# Patient Record
Sex: Male | Born: 2005
Health system: Southern US, Community
[De-identification: ages and names within clinical notes are randomized; demographics above are authoritative.]

---
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
## 2023-01-10 IMAGING — DX DG CHEST 2V
2 series · 2 of 2 positions shown · non-contrast
Comparison: None.

CLINICAL DATA: Shortness of breath

EXAM:
CHEST - 2 VIEW

[chest pa]
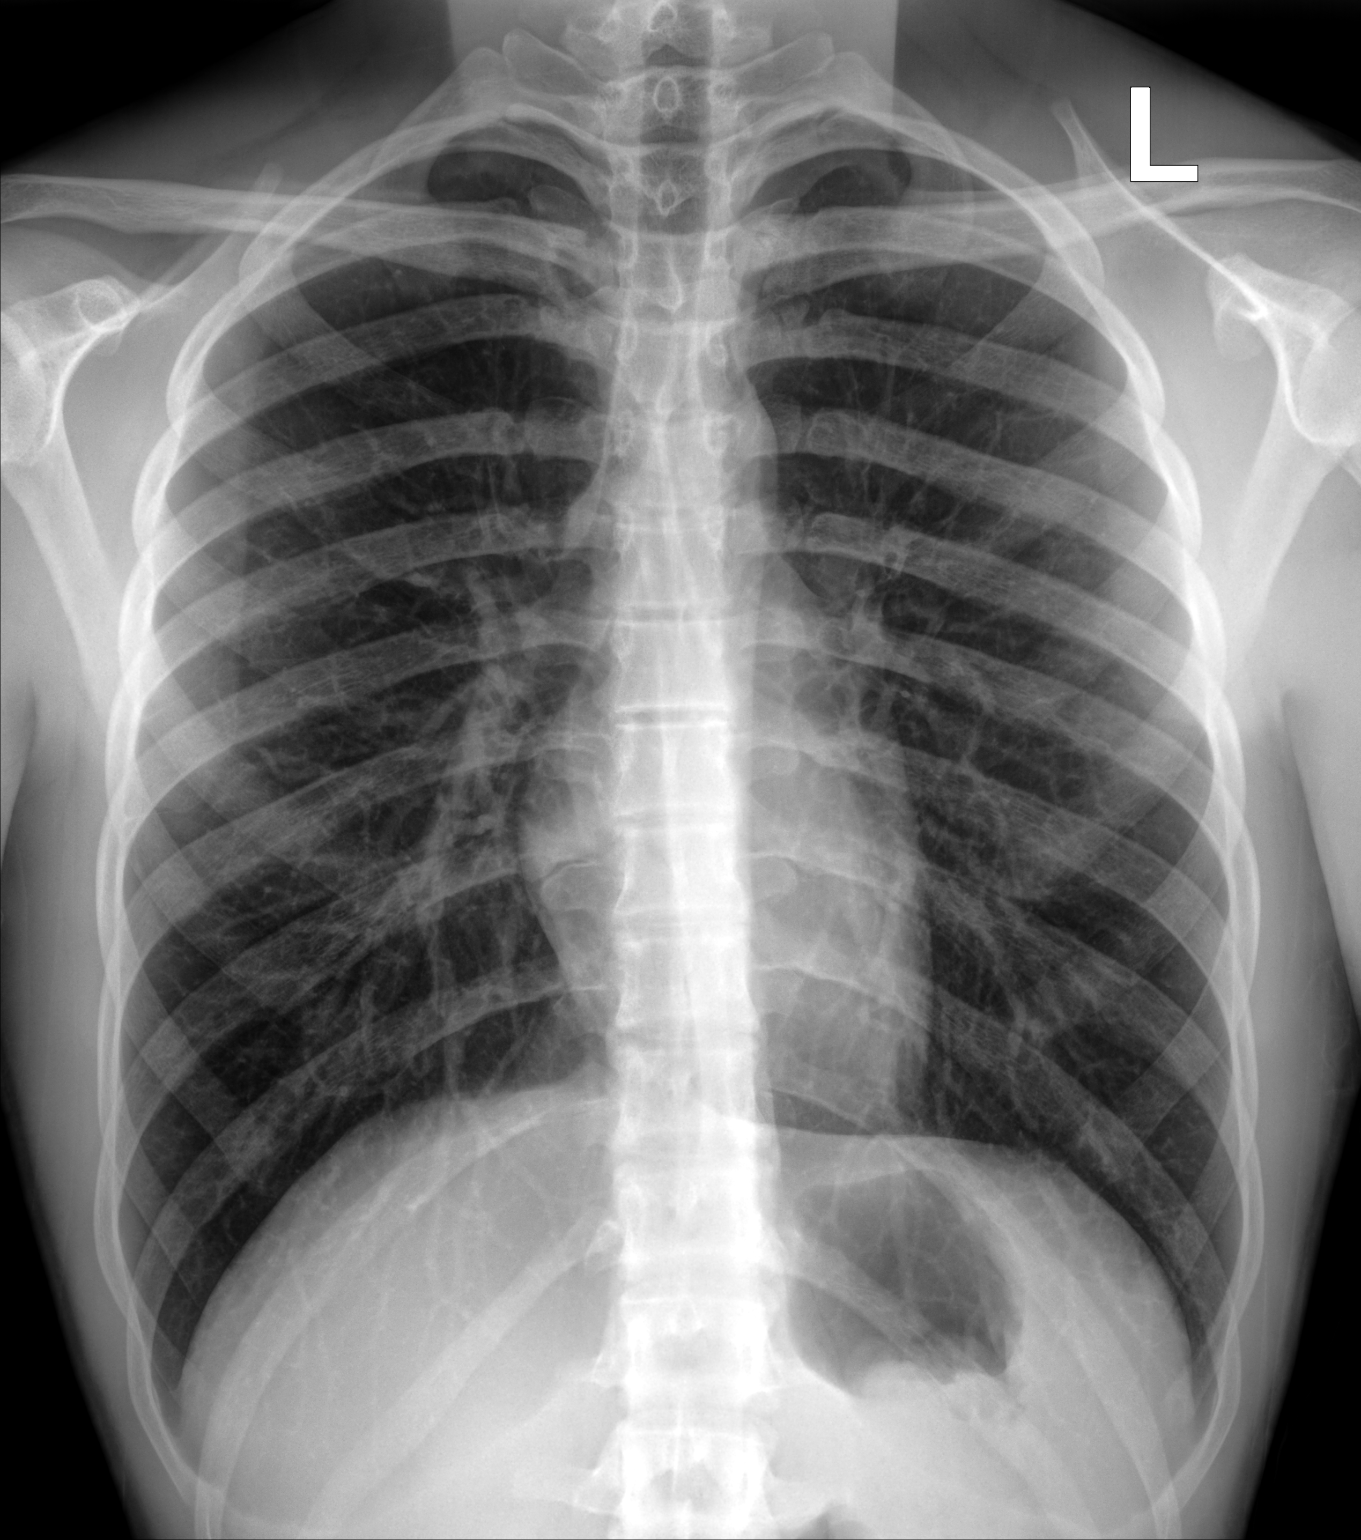

[chest lat]
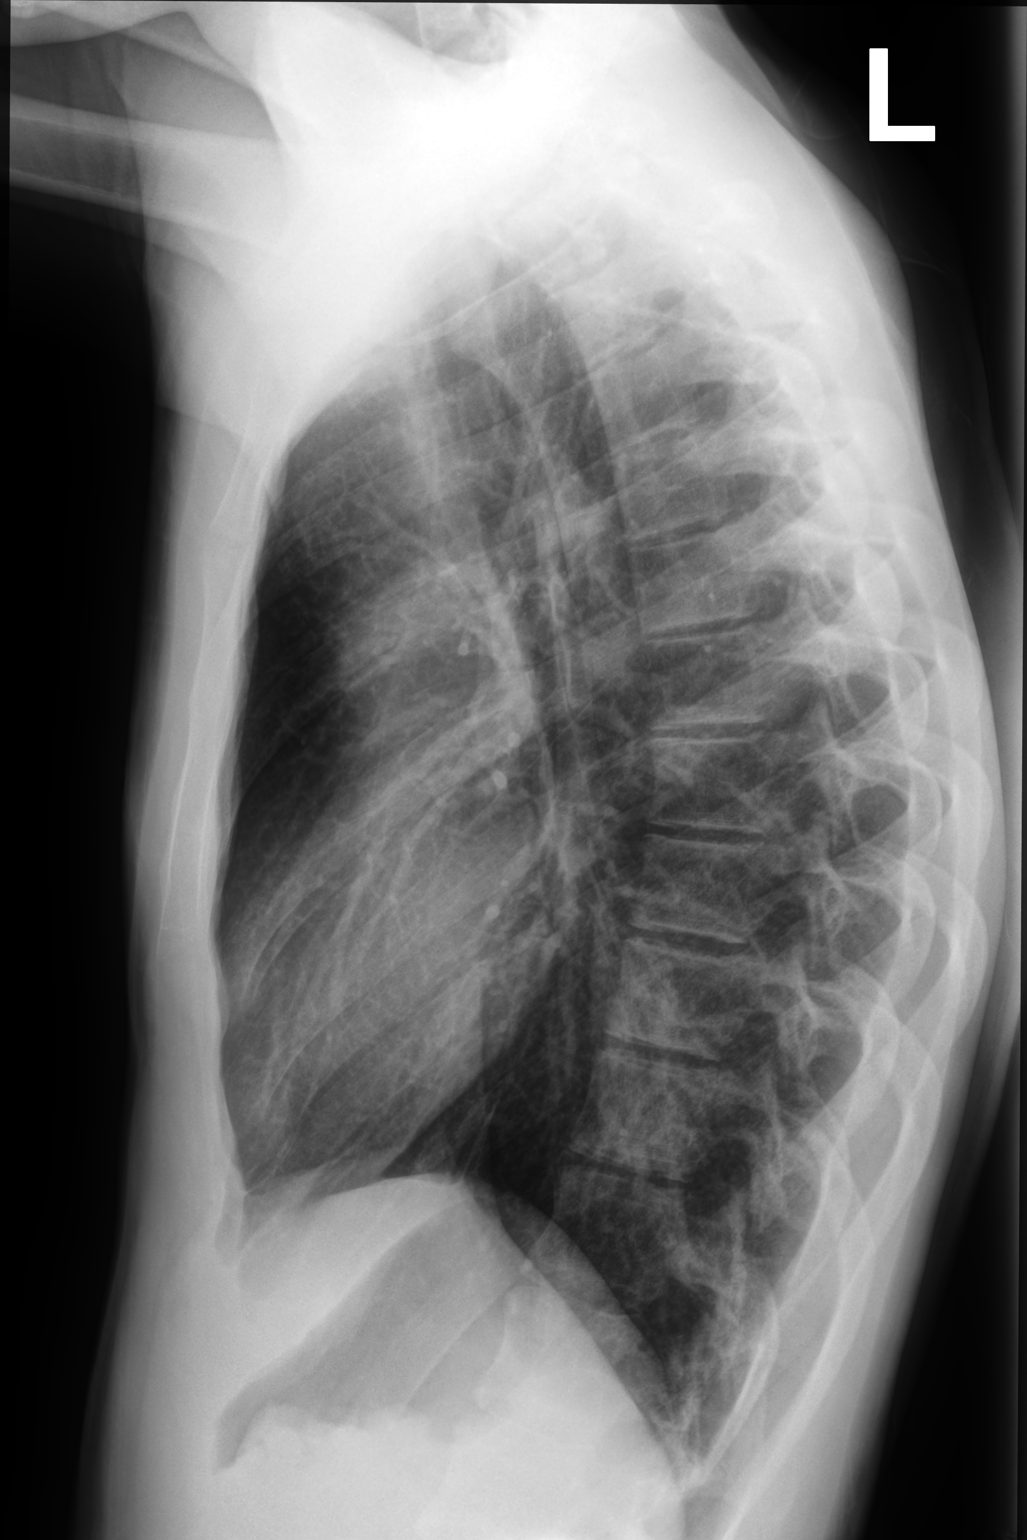

[2 of 2 positions shown; findings below may reference images not displayed]

FINDINGS: The heart size and mediastinal contours are within normal limits.
Both lungs are clear. The visualized skeletal structures are
unremarkable.
IMPRESSION: No active cardiopulmonary disease.

## 2023-04-18 DIAGNOSIS — R051 Acute cough: Secondary | ICD-10-CM | POA: Diagnosis not present

## 2023-04-18 DIAGNOSIS — J069 Acute upper respiratory infection, unspecified: Secondary | ICD-10-CM | POA: Diagnosis not present

## 2023-04-18 DIAGNOSIS — R509 Fever, unspecified: Secondary | ICD-10-CM | POA: Diagnosis not present

## 2023-04-18 DIAGNOSIS — R070 Pain in throat: Secondary | ICD-10-CM | POA: Diagnosis not present

## 2023-04-18 DIAGNOSIS — R0981 Nasal congestion: Secondary | ICD-10-CM | POA: Diagnosis not present

## 2023-04-21 DIAGNOSIS — R051 Acute cough: Secondary | ICD-10-CM | POA: Diagnosis not present

## 2023-04-21 DIAGNOSIS — R509 Fever, unspecified: Secondary | ICD-10-CM | POA: Diagnosis not present

## 2023-04-21 DIAGNOSIS — R519 Headache, unspecified: Secondary | ICD-10-CM | POA: Diagnosis not present

## 2023-05-03 ENCOUNTER — Institutional Professional Consult (permissible substitution): Payer: Self-pay | Admitting: Adult Health
